# Patient Record
Sex: Female | Born: 1985 | Race: White | Hispanic: Yes | Marital: Married | Smoking: Never smoker
Health system: Southern US, Community
[De-identification: ages and names within clinical notes are randomized; demographics above are authoritative.]

---

## 2017-07-13 ENCOUNTER — Ambulatory Visit (INDEPENDENT_AMBULATORY_CARE_PROVIDER_SITE_OTHER): Payer: 59

## 2017-07-13 ENCOUNTER — Ambulatory Visit: Payer: 59 | Admitting: Sports Medicine

## 2017-07-13 ENCOUNTER — Encounter: Payer: Self-pay | Admitting: Sports Medicine

## 2017-07-13 DIAGNOSIS — M5412 Radiculopathy, cervical region: Secondary | ICD-10-CM

## 2017-07-13 DIAGNOSIS — M5416 Radiculopathy, lumbar region: Secondary | ICD-10-CM

## 2017-07-13 MED ORDER — CYCLOBENZAPRINE HCL 10 MG PO TABS: ORAL_TABLET | ORAL | 0 refills | Status: DC

## 2017-07-13 NOTE — Assessment & Plan Note (Signed)
Left L4 radiculitis, has already had x-rays at an outside facility. Adding formal physical therapy, continue meloxicam, Flexeril low-dose at bedtime. Return in 6 weeks, MR for interventional planning if no better, we did discuss the natural history of degenerative disc disease.

## 2017-07-13 NOTE — Assessment & Plan Note (Signed)
Formal physical therapy, continue meloxicam, Flexeril at bedtime. X-rays. Return to see me in 6 weeks for this as well.

## 2017-07-13 NOTE — Progress Notes (Signed)
   Subjective:    I'm seeing this patient as a consultation for:  Dr. Darlis LoanFaiza Rais-Reynolds  CC: Back and leg pain  HPI: For the past several years this pleasant 31 year old female has had pain in her back with radiation down the left leg, to the great toe, worse with sitting, flexion, Valsalva, no bowel or bladder dysfunction, saddle numbness, no constitutional symptoms.  She has been to a chiropractor several times with meager benefits, has never done formal physical therapy, is using meloxicam with some efficacy.  In addition she has pain in her neck with radiation down the right arm to the hand with some weakness.  This is fairly stable, present for years as well.  Moderate, persistent without radiation.  Past medical history, Surgical history, Family history not pertinant except as noted below, Social history, Allergies, and medications have been entered into the medical record, reviewed, and no changes needed.   Review of Systems: No headache, visual changes, nausea, vomiting, diarrhea, constipation, dizziness, abdominal pain, skin rash, fevers, chills, night sweats, weight loss, swollen lymph nodes, body aches, joint swelling, muscle aches, chest pain, shortness of breath, mood changes, visual or auditory hallucinations.   Objective:   General: Well Developed, well nourished, and in no acute distress.  Neuro:  Extra-ocular muscles intact, able to move all 4 extremities, sensation grossly intact.  Deep tendon reflexes tested were normal. Psych: Alert and oriented, mood congruent with affect. ENT:  Ears and nose appear unremarkable.  Hearing grossly normal. Neck: Unremarkable overall appearance, trachea midline.  No visible thyroid enlargement. Eyes: Conjunctivae and lids appear unremarkable.  Pupils equal and round. Skin: Warm and dry, no rashes noted.  Cardiovascular: Pulses palpable, no extremity edema. Neck: Negative spurling's Full neck range of motion Grip strength and sensation  normal in bilateral hands Strength good C4 to T1 distribution No sensory change to C4 to T1 Reflexes normal Back Exam:  Inspection: Unremarkable  Motion: Flexion 45 deg, Extension 45 deg, Side Bending to 45 deg bilaterally,  Rotation to 45 deg bilaterally  SLR laying: Negative  XSLR laying: Negative  Palpable tenderness: None. FABER: negative. Sensory change: Gross sensation intact to all lumbar and sacral dermatomes.  Reflexes: 2+ at both patellar tendons, 2+ at achilles tendons, Babinski's downgoing.  Strength at foot  Plantar-flexion: 5/5 Dorsi-flexion: 5/5 Eversion: 5/5 Inversion: 5/5  Leg strength  Quad: 5/5 Hamstring: 5/5 Hip flexor: 5/5 Hip abductors: 5/5  Gait unremarkable.  Impression and Recommendations:   This case required medical decision making of moderate complexity.  Left lumbar radiculitis Left L4 radiculitis, has already had x-rays at an outside facility. Adding formal physical therapy, continue meloxicam, Flexeril low-dose at bedtime. Return in 6 weeks, MR for interventional planning if no better, we did discuss the natural history of degenerative disc disease.  Radiculitis of right cervical region Formal physical therapy, continue meloxicam, Flexeril at bedtime. X-rays. Return to see me in 6 weeks for this as well.  ___________________________________________ Ihor Austinhomas J. Benjamin Stainhekkekandam, M.D., ABFM., CAQSM. Primary Care and Sports Medicine [facility]  Adjunct Instructor of Family Medicine  [employer]

## 2017-08-02 ENCOUNTER — Ambulatory Visit: Payer: 59 | Admitting: Rehabilitative and Restorative Service Providers"

## 2017-08-26 ENCOUNTER — Ambulatory Visit (INDEPENDENT_AMBULATORY_CARE_PROVIDER_SITE_OTHER): Payer: 59 | Admitting: Rehabilitative and Restorative Service Providers"

## 2017-08-26 ENCOUNTER — Encounter (INDEPENDENT_AMBULATORY_CARE_PROVIDER_SITE_OTHER): Payer: Self-pay

## 2017-08-26 ENCOUNTER — Encounter: Payer: Self-pay | Admitting: Rehabilitative and Restorative Service Providers"

## 2017-08-26 ENCOUNTER — Other Ambulatory Visit: Payer: Self-pay | Admitting: Sports Medicine

## 2017-08-26 DIAGNOSIS — M545 Low back pain: Secondary | ICD-10-CM | POA: Diagnosis not present

## 2017-08-26 DIAGNOSIS — M542 Cervicalgia: Secondary | ICD-10-CM

## 2017-08-26 DIAGNOSIS — R293 Abnormal posture: Secondary | ICD-10-CM

## 2017-08-26 DIAGNOSIS — M5416 Radiculopathy, lumbar region: Secondary | ICD-10-CM

## 2017-08-26 DIAGNOSIS — R29898 Other symptoms and signs involving the musculoskeletal system: Secondary | ICD-10-CM

## 2017-08-26 NOTE — Patient Instructions (Addendum)
Trunk: Prone Extension (Press-Ups)    Lie on stomach on firm, flat surface. Relax bottom and legs. Raise chest in air with elbows straight. Keep hips flat on surface, sag stomach. Hold __2-3 __ seconds. Repeat _10___ times. Do __1__ sessions per day. CAUTION: Movement should be gentle and slow.    Pelvic Press    Place hands under belly between navel and pubic bone, palms up. Feel pressure on hands. Increase pressure on hands by pressing pelvis down. This is NOT a pelvic tilt. Hold _5__ seconds. Relax. Repeat _10__ times.  HIP: Hamstrings - Supine  Place strap around foot. Raise leg up, keeping knee straight.  Bend opposite knee to protect back if indicated. Hold 30 seconds. 3 reps per set, 2-3 sets per day  Outer Hip Stretch: Reclined IT Band Stretch (Strap)   Strap around one foot, pull leg out away from body until you feel a pull or stretch in the inside of your hip, with shoulders on mat. Hold for 30 seconds. Repeat 3 times each leg. 2-3 times/day.    TENS UNIT: This is helpful for muscle pain and spasm.   Search and Purchase a TENS 7000 2nd edition at www.tenspros.com. It should be less than $30.     TENS unit instructions: Do not shower or bathe with the unit on Turn the unit off before removing electrodes or batteries If the electrodes lose stickiness add a drop of water to the electrodes after they are disconnected from the unit and place on plastic sheet. If you continued to have difficulty, call the TENS unit company to purchase more electrodes. Do not apply lotion on the skin area prior to use. Make sure the skin is clean and dry as this will help prolong the life of the electrodes. After use, always check skin for unusual red areas, rash or other skin difficulties. If there are any skin problems, does not apply electrodes to the same area. Never remove the electrodes from the unit by pulling the wires. Do not use the TENS unit or electrodes other than as  directed. Do not change electrode placement without consultating your therapist or physician. Keep 2 fingers with between each electrode.

## 2017-08-26 NOTE — Therapy (Signed)
[facility] [address] [place], [place], [address] Phone: [phone]   Fax:  [phone]  Physical Therapy Evaluation  Patient Details  Name: Jacqueline StagerMelissa Rocha MRN: [id number] Date of Birth: 01-14-1986 Referring Provider: Dr Benjamin Stainhekkekandam   Encounter Date: 08/26/2017  PT End of Session - 08/26/17 0852    Visit Number  1    Number of Visits  12    Date for PT Re-Evaluation  10/07/17    PT Start Time  0850    PT Stop Time  0948    PT Time Calculation (min)  58 min    Activity Tolerance  Patient tolerated treatment well       History reviewed. No pertinent past medical history.  History reviewed. No pertinent surgical history.  There were no vitals filed for this visit.   Subjective Assessment - 08/26/17 0900    Subjective  Patient reports history of LBP for 11 years. She has stiffness and paiin in the LB radiating into the Lt LE posterior thigh into lateral calf. She has had flare up of pain over the past 4 months. Patient is not aware of any injury. She has had pain since she gave birth to her daughter 11 years ago.     Pertinent History  Rt cervical radiculopathy with Rt UE weakness and sensory changes - partially resolved with therapy Feb-Mar 2018    How long can you sit comfortably?  15 min     How long can you stand comfortably?  5 min     How long can you walk comfortably?  30 min     Diagnostic tests  xrays     Patient Stated Goals  learn how to manage LBP without depend on medication     Currently in Pain?  No/denies    Pain Location  Back    Pain Orientation  Left;Lower;Mid    Pain Descriptors / Indicators  Sore;Tightness    Pain Type  Chronic pain    Pain Radiating Towards  Lt posterior thigh to knee to lateral calf     Pain Onset  More than a month ago    Pain Frequency  Intermittent    Aggravating Factors   bending forward; prolonged standing; prolonged sitting; footwear - worse with older shoes;  carrying; lifting    Pain Relieving Factors  meds; squatting against wall; twisting; chiropractic care          [facility] Assessment - 08/26/17 0001      Assessment   Medical Diagnosis  Lt lumbar radiculitis; Rt cervical radiculopathy     Referring Provider  Dr Benjamin Stainhekkekandam    Onset Date/Surgical Date  03/23/17 LBP pain intermittently for 11 years     Hand Dominance  Right    Next MD Visit  PRN     Prior Therapy  yes for cervical/shoulder problems 2018; chiropractic care for LB for ~ 2 years       Precautions   Precautions  None      Balance Screen   Has the patient fallen in the past 6 months  Yes    How many times?  2    Has the patient had a decrease in activity level because of a fear of falling?   No    Is the patient reluctant to leave their home because of a fear of falling?   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     Leisure  household chores; child care; church      Observation/Other Assessments   Focus on Therapeutic Outcomes (FOTO)   54% limitation       Sensation   Additional Comments  no numbness per pt report       Posture/Postural Control   Posture Comments  head forward; shoulders rounded and elevatred; Lt PSIS higher than Rt;       AROM   Lumbar Flexion  100%    Lumbar Extension  70%    Lumbar - Right Side Bend  80%    Lumbar - Left Side Bend  75% pain Rt LB     Lumbar - Right Rotation  65%    Lumbar - Left Rotation  60%      Strength   Overall Strength Comments  5/5 bilat LE's       Flexibility   Hamstrings  tight Rt > Lt     Quadriceps  WFL's     ITB  tight Rt    Piriformis  tight Lt with pain in Rt LB       Palpation   Spinal mobility   hypomobile lumbar spine; tender to palpation coccyx with pain referred to posterior Lt thigh     SI assessment   elevated Lt PSIS; illium    Palpation comment  tight Rt psoas; Lt QL; coccyx       Special Tests   Other special tests  Figure 4 - slight pain Rt testing - pain in the Rt LB area              Objective measurements completed on examination: See above findings.      [facility] Adult PT Treatment/Exercise - 08/26/17 0001      Lumbar Exercises: Stretches   Passive Hamstring Stretch  2 reps;30 seconds supine with strap     Hip Flexor Stretch  2 reps;30 seconds seated - felt stretch in quads Rt > Lt     Press Ups  -- 2-3 sec hold x 10     ITB Stretch Limitations  hip adductor stretch 30 sec x 2 Rt       Lumbar Exercises: Prone   Other Prone Lumbar Exercises  pelvic press 5 sec x 10       Moist Heat Therapy   Number Minutes Moist Heat  20 Minutes    Moist Heat Location  Lumbar Spine      Electrical Stimulation   Electrical Stimulation Location  bilat lumbar     Electrical Stimulation Action  IFC    Electrical Stimulation Parameters  to tolerance    Electrical Stimulation Goals  Pain;Tone             PT Education - 08/26/17 0932    Education provided  Yes    Education Details  HEP     Person(s) Educated  Patient    Methods  Explanation;Demonstration;Tactile cues;Verbal cues    Comprehension  Verbalized understanding;Returned demonstration;Verbal cues required;Tactile cues required          PT Long Term Goals - 08/26/17 1148      PT LONG TERM  GOAL #1   Title  Improve posture and alignment without hyperextension of knees and improved alignment through hips and pelvis 10/07/17    Time  6    Period  Weeks    Status  New      PT LONG TERM GOAL #2   Title  increase core strength and stability with patient tolerating increased sitting; standing; walking by 50% with decreased pain 10/07/17    Time  6    Period  Weeks    Status  New      PT LONG TERM GOAL  #3   Title  Patient reports understanding of back care; ergonomics; appropriate exercise program 10/07/17    Time  6    Period  Weeks    Status  New      PT LONG TERM GOAL #4   Title  Independent in HEP 10/07/17    Time  6    Period  Weeks    Status  New      PT LONG TERM GOAL #5   Title  Improve FOTO to </= 42% limitation 10/07/17    Time  6    Period  Weeks    Status  New             Plan - 08/26/17 1143    Clinical Impression Statement  Murrel presents with flare up of LBP and Lt LE radicular pain over the past 4 months. She has had LBP for ~11 years which started following the birth of her daughter. Valicia has poor posture and alignment; limited trunk and LE mobility/ROM; poor core strength and stability; muscular tightness to palpation - Rt psoas/Rt piriformis; tenderness and pain with palpation at coccyx; decreased functional activities. Patient wil benefit form PT to address problems identified.     History and Personal Factors relevant to plan of care:  cervical dysfunction; Rt cervical radiculopathy; Rt shoulder pathology    Clinical Presentation  Stable    Clinical Decision Making  Low    Rehab Potential  Good    PT Frequency  2x / week    PT Duration  6 weeks    PT Treatment/Interventions  Patient/family education;ADLs/Self Care Home Management;Cryotherapy;Electrical Stimulation;Iontophoresis 4mg /ml Dexamethasone;Moist Heat;Ultrasound;Dry needling;Manual techniques;Neuromuscular re-education;Therapeutic activities;Therapeutic exercise    PT Next Visit Plan  review exercise; further assessment of SI/pelvic dysfunction; manual therapy; ther ex; modalities as indicated     Consulted and Agree with Plan of Care  Patient       Patient will benefit from skilled therapeutic intervention in order to improve the following deficits and impairments:  Postural dysfunction, Improper body mechanics, Pain, Increased fascial restricitons, Increased muscle spasms, Decreased mobility,  Decreased range of motion, Decreased strength, Decreased activity tolerance  Visit Diagnosis: Acute left-sided low back pain, with sciatica presence unspecified - Plan: PT plan of care cert/re-cert  Other symptoms and signs involving the musculoskeletal system - Plan: PT plan of care cert/re-cert  Cervicalgia - Plan: PT plan of care cert/re-cert  Abnormal posture - Plan: PT plan of care cert/re-cert     Problem List Patient Active Problem List   Diagnosis Date Noted  . Left lumbar radiculitis 07/13/2017  . Radiculitis of right cervical region 07/13/2017    Greogry Goodwyn Rober Minion PT, MPH  08/26/2017, 12:55 PM  [facility] [address] [place], [place], [address] Phone: [phone]   Fax:  [phone]  Name: Genavieve Mangiapane MRN: [id number] Date of Birth: [birth year]

## 2017-08-29 ENCOUNTER — Ambulatory Visit: Payer: 59 | Admitting: Physical Therapy

## 2017-08-29 ENCOUNTER — Encounter: Payer: Self-pay | Admitting: Physical Therapy

## 2017-08-29 DIAGNOSIS — R293 Abnormal posture: Secondary | ICD-10-CM | POA: Diagnosis not present

## 2017-08-29 DIAGNOSIS — R29898 Other symptoms and signs involving the musculoskeletal system: Secondary | ICD-10-CM | POA: Diagnosis not present

## 2017-08-29 DIAGNOSIS — M545 Low back pain: Secondary | ICD-10-CM

## 2017-08-29 DIAGNOSIS — M542 Cervicalgia: Secondary | ICD-10-CM

## 2017-08-29 NOTE — Patient Instructions (Addendum)
WALKING  Walking is a great form of exercise to increase your strength, endurance and overall fitness.  A walking program can help you start slowly and gradually build endurance as you go.  Everyone's ability is different, so each person's starting point will be different.  You do not have to follow them exactly.  The are just samples. You should simply find out what's right for you and stick to that program.   In the beginning, you'll start off walking 2-3 times a day for short distances.  As you get stronger, you'll be walking further at just 1-2 times per day.  A. You Can Walk For A Certain Length Of Time Each Day    Walk 5 minutes 3 times per day.  Increase 2 minutes every 2 days (3 times per day).  Work up to 25-30 minutes (1-2 times per day).   Example:   Day 1-2 5 minutes 3 times per day   Day 7-8 12 minutes 2-3 times per day   Day 13-14 25 minutes 1-2 times per day  B. You Can Walk For a Certain Distance Each Day     Distance can be substituted for time.    Example:   3 trips to mailbox (at road)   3 trips to corner of block   3 trips around the block  C. Go to local high school and use the track.    [facility] [address] [place], [place] [address]  [phone] (office) [phone] (fax)

## 2017-08-29 NOTE — Therapy (Signed)
[place], [place], [address] Phone: [phone]   Fax:  [phone]  Physical Therapy Treatment  Patient Details  Name: Jacqueline Rocha MRN: [id number] Date of Birth: [birth year] Referring Provider: Dr. Dianah Field   Encounter Date: 08/29/2017  PT End of Session - 08/29/17 0725    Visit Number  2    Number of Visits  12    Date for PT Re-Evaluation  10/07/17    PT Start Time  0724    PT Stop Time  0807    PT Time Calculation (min)  43 min    Activity Tolerance  Patient tolerated treatment well;No increased pain    Behavior During Therapy  [facility] for tasks assessed/performed       History reviewed. No pertinent past medical history.  History reviewed. No pertinent surgical history.  There were no vitals filed for this visit.  Subjective Assessment - 08/29/17 0726    Subjective  Jacqueline Rocha reports she was sore, "in a good way".  She notices if she stands in one position longer than 1/2 hr, it hurts when she moves. She believes her back pain began when she started current job 3 yrs ago, not walking as much.     Pertinent History  Rt cervical radiculopathy with Rt UE weakness and sensory changes - partially resolved with therapy Feb-Mar 2018    Patient Stated Goals  learn how to manage LBP without depend on medication     Currently in Pain?  No/denies         [facility] PT Assessment - 08/29/17 0001      Assessment   Medical Diagnosis  Lt lumbar radiculitis; Rt cervical radiculopathy     Referring Provider  Dr. Dianah Field    Onset Date/Surgical Date  03/23/17 LBP pain intermittently for 11 years     Hand Dominance  Right    Next MD Visit  PRN      Palpation   SI assessment   In standing: Rt elevated ilium; Lt ASIS higer than Rt. Rt sacral torsion.           [facility] Adult PT Treatment/Exercise - 08/29/17 0001      Self-Care   Self-Care  Other Self-Care Comments    Other Self-Care Comments    educated pt on freq position changes at work, and on starting walking program.       Lumbar Exercises: Stretches   Passive Hamstring Stretch  2 reps;60 seconds seated and supine    Hip Flexor Stretch  2 reps;30 seconds seated - felt stretch in quads Rt > Lt     Standing Extension  3 reps 2-3 sec pause    Piriformis Stretch  2 reps;30 seconds      Lumbar Exercises: Aerobic   Stationary Bike  NuStep L5: 5.5 min       Lumbar Exercises: Seated   Other Seated Lumbar Exercises  modified pigeon pose x 2 reps each leg with yoga block assist       Modalities   Modalities  -- pt declined      Manual Therapy   Manual Therapy  Muscle Energy Technique    Muscle Energy Technique  MET to correct Rt sacral torsion with contract relax of hip rotators and stretching into IR.              PT Education - 08/29/17 1405    Education provided  Yes    Education Details  Walking program  guidelines.     Person(s) Educated  Patient    Methods  Explanation;Demonstration;Handout    Comprehension  Verbalized understanding;Returned demonstration          PT Long Term Goals - 08/29/17 0743      PT LONG TERM GOAL #1   Title  Improve posture and alignment without hyperextension of knees and improved alignment through hips and pelvis 10/07/17    Time  6    Period  Weeks    Status  On-going      PT LONG TERM GOAL #2   Title  increase core strength and stability with patient tolerating increased sitting; standing; walking by 50% with decreased pain 10/07/17    Time  6    Period  Weeks    Status  On-going      PT LONG TERM GOAL #3   Title  Patient reports understanding of back care; ergonomics; appropriate exercise program 10/07/17    Time  6    Period  Weeks    Status  On-going      PT LONG TERM GOAL #4   Title  Independent in HEP 10/07/17    Time  6    Period  Weeks    Status  On-going      PT LONG TERM GOAL #5   Title  Improve FOTO to </= 42% limitation 10/07/17    Time  6    Period   Weeks    Status  On-going            Plan - 08/29/17 1406    Clinical Impression Statement  Pt reporting less pain since last visit. Pt shown alternative versions to allow for improved compliance of HEP.  Pt had slight asymmetry in pelvis; will further assess next visit.  Progressing towards goals.     Rehab Potential  Good    PT Frequency  2x / week    PT Duration  6 weeks    PT Treatment/Interventions  Patient/family education;ADLs/Self Care Home Management;Cryotherapy;Electrical Stimulation;Iontophoresis 32m/ml Dexamethasone;Moist Heat;Ultrasound;Dry needling;Manual techniques;Neuromuscular re-education;Therapeutic activities;Therapeutic exercise    PT Next Visit Plan  core/spinal stabilization; assess pelvis alignment.     Consulted and Agree with Plan of Care  Patient       Patient will benefit from skilled therapeutic intervention in order to improve the following deficits and impairments:  Postural dysfunction, Improper body mechanics, Pain, Increased fascial restricitons, Increased muscle spasms, Decreased mobility, Decreased range of motion, Decreased strength, Decreased activity tolerance  Visit Diagnosis: Acute left-sided low back pain, with sciatica presence unspecified  Other symptoms and signs involving the musculoskeletal system  Cervicalgia  Abnormal posture     Problem List Patient Active Problem List   Diagnosis Date Noted  . Left lumbar radiculitis 07/13/2017  . Radiculitis of right cervical region 07/13/2017   JKerin Perna PTA 08/29/17 2:14 PM  [address] [address]: [phone]  Fax:  [phone] Name: MCortny BambachMRN: [id number] of Birth: [birth year]

## 2017-08-30 MED ORDER — CYCLOBENZAPRINE HCL 10 MG PO TABS: ORAL_TABLET | ORAL | 0 refills | Status: AC

## 2017-09-01 ENCOUNTER — Ambulatory Visit (INDEPENDENT_AMBULATORY_CARE_PROVIDER_SITE_OTHER): Payer: 59 | Admitting: Physical Therapy

## 2017-09-01 ENCOUNTER — Encounter: Payer: Self-pay | Admitting: Physical Therapy

## 2017-09-01 DIAGNOSIS — R29898 Other symptoms and signs involving the musculoskeletal system: Secondary | ICD-10-CM

## 2017-09-01 DIAGNOSIS — R293 Abnormal posture: Secondary | ICD-10-CM | POA: Diagnosis not present

## 2017-09-01 DIAGNOSIS — M542 Cervicalgia: Secondary | ICD-10-CM

## 2017-09-01 DIAGNOSIS — M545 Low back pain: Secondary | ICD-10-CM | POA: Diagnosis not present

## 2017-09-01 NOTE — Patient Instructions (Signed)
Pelvic Press     Place hands under belly between navel and pubic bone, palms up. Feel pressure on hands. Increase pressure on hands by pressing pelvis down. This is NOT a pelvic tilt. Hold __5_ seconds. Relax. Repeat _10__ times. Once a day.  KNEE: Flexion - Prone   Hold pelvic press. Bend knee, then return the foot down. Repeat on opposite leg. Do not raise hips. _10__ reps per set. When this is mastered, pull both heels up at same time, x 10 reps.  Once a day   Leg Lift: One-Leg   Press pelvis down. Keep knee straight; lengthen and lift one leg (from waist). Do not twist body. Keep other leg down. Hold _1__ seconds. Relax. Repeat 10 time. Repeat with other leg.  HIP: Extension / KNEE: Flexion - Prone    Hold pelvic press. Bend knee, squeeze glutes. Raise leg up  10___ reps per set, _1__ sets per day, _1__ time a day.   Axial Extension- Upper body sequence * always start with pelvic press    Lie on stomach with forehead resting on floor and arms at sides. Tuck chin in and raise head from floor without bending it up or down. Repeat ___10_ times per set. Do __1__ sets per session. Do _1___ sessions per day.  Scapular Retraction: Abduction (Prone)    Lie with upper arms straight out from sides, elbows bent to 90. Pinch shoulder blades together and raise arms a few inches from floor. Repeat ____ times per set. Do ____ sets per session. Do ____ sessions per day.  http://orth.exer.us/957   Copyright  VHI. All rights reserved.   [facility] [address] [place], [place] [address]  [phone] (office) [phone] (fax)

## 2017-09-01 NOTE — Therapy (Signed)
[facility] [address] [place], [place], [address] Phone: [phone]   Fax:  [phone]  Physical Therapy Treatment  Patient Details  Name: Jacqueline Rocha MRN: [id number] Date of Birth: 1986/07/10 Referring Provider: Dr. Benjamin Stain   Encounter Date: 09/01/2017  PT End of Session - 09/01/17 0817    Visit Number  3    Number of Visits  12    Date for PT Re-Evaluation  10/07/17    PT Start Time  0810 pt arrived late    PT Stop Time  0901    PT Time Calculation (min)  51 min    Activity Tolerance  Patient tolerated treatment well;No increased pain    Behavior During Therapy  [facility] for tasks assessed/performed       History reviewed. No pertinent past medical history.  History reviewed. No pertinent surgical history.  There were no vitals filed for this visit.  Subjective Assessment - 09/01/17 0818    Subjective  "I've been able to do something I haven't been able to do in a long time.  I bathed the dog."  She also states she didn't take any of her medicine yesterday and she is surprised, she still feels good.  She is trying to increase her steps daily.     Patient Stated Goals  learn how to manage LBP without depend on medication     Currently in Pain?  No/denies         [facility] PT Assessment - 09/01/17 0001      Assessment   Medical Diagnosis  Lt lumbar radiculitis; Rt cervical radiculopathy     Referring Provider  Dr. Benjamin Stain    Onset Date/Surgical Date  03/23/17 LBP pain intermittently for 11 years     Hand Dominance  Right    Next MD Visit  PRN        [facility] Adult PT Treatment/Exercise - 09/01/17 0001      Lumbar Exercises: Stretches   Passive Hamstring Stretch  Right;Left;2 reps;30 seconds    Press Ups  -- 2-3 sec hold x 10       Lumbar Exercises: Aerobic   Stationary Bike  NuStep L5: 5.5 min  (arms/ legs)      Lumbar Exercises: Seated   Other Seated Lumbar Exercises  modified childs pose  with lateral trunk flexion, with arms outstretched on table, 2 reps each direction      Lumbar Exercises: Prone   Other Prone Lumbar Exercises  pelvic press x 5 sec x 5 reps; pelvic press with unilateral knee bend x 5 reps each leg, with hip ext knee straight x 5 reps each, with hip ext knee bent x 5 each leg.   goal post arms with axial ext x 10 reps    Other Prone Lumbar Exercises  pigeon pose x 30 sec x 2 reps each side.       Lumbar Exercises: Quadruped   Other Quadruped Lumbar Exercises  childs pose x 3 reps, then 1 rep with lateral trunkl flexion      Moist Heat Therapy   Number Minutes Moist Heat  15 Minutes    Moist Heat Location  Lumbar Spine      Electrical Stimulation   Electrical Stimulation Location  bilat upper trap/ bilat lumbar paraspinals    Electrical Stimulation Action  premod to each area    Electrical Stimulation Parameters  to tolerance    Electrical Stimulation Goals  Pain;Tone  PT Education - 09/01/17 [phone]    Education provided  Yes    Education Details  HEP    Person(s) Educated  Patient    Methods  Explanation;Handout;Demonstration;Tactile cues;Verbal cues    Comprehension  Verbalized understanding;Returned demonstration          PT Long Term Goals - 08/29/17 0743      PT LONG TERM GOAL #1   Title  Improve posture and alignment without hyperextension of knees and improved alignment through hips and pelvis 10/07/17    Time  6    Period  Weeks    Status  On-going      PT LONG TERM GOAL #2   Title  increase core strength and stability with patient tolerating increased sitting; standing; walking by 50% with decreased pain 10/07/17    Time  6    Period  Weeks    Status  On-going      PT LONG TERM GOAL #3   Title  Patient reports understanding of back care; ergonomics; appropriate exercise program 10/07/17    Time  6    Period  Weeks    Status  On-going      PT LONG TERM GOAL #4   Title  Independent in HEP 10/07/17    Time  6     Period  Weeks    Status  On-going      PT LONG TERM GOAL #5   Title  Improve FOTO to </= 42% limitation 10/07/17    Time  6    Period  Weeks    Status  On-going            Plan - 09/01/17 0851    Clinical Impression Statement  Pt had positive response to last treatment; reporting less pain and has been able to increase her steps/day.  She tolerated all exercises well without difficulty, just slight soreness.  Pt making great gains towards therapy goals.     Rehab Potential  Good    PT Frequency  2x / week    PT Duration  6 weeks    PT Treatment/Interventions  Patient/family education;ADLs/Self Care Home Management;Cryotherapy;Electrical Stimulation;Iontophoresis 4mg /ml Dexamethasone;Moist Heat;Ultrasound;Dry needling;Manual techniques;Neuromuscular re-education;Therapeutic activities;Therapeutic exercise    PT Next Visit Plan  core/spinal stabilization; assess pelvis alignment.     Consulted and Agree with Plan of Care  Patient       Patient will benefit from skilled therapeutic intervention in order to improve the following deficits and impairments:  Postural dysfunction, Improper body mechanics, Pain, Increased fascial restricitons, Increased muscle spasms, Decreased mobility, Decreased range of motion, Decreased strength, Decreased activity tolerance  Visit Diagnosis: Acute left-sided low back pain, with sciatica presence unspecified  Other symptoms and signs involving the musculoskeletal system  Cervicalgia  Abnormal posture     Problem List Patient Active Problem List   Diagnosis Date Noted  . Left lumbar radiculitis 07/13/2017  . Radiculitis of right cervical region 07/13/2017   Mayer CamelJennifer Carlson-Long, PTA 09/01/17 9:00 AM  [facility] [address] [place], [place], [address] Phone: [phone]   Fax:  [phone]  Name: Jacqueline Rocha MRN: [id number] Date of Birth: [birth year]

## 2017-09-05 ENCOUNTER — Ambulatory Visit (INDEPENDENT_AMBULATORY_CARE_PROVIDER_SITE_OTHER): Payer: 59 | Admitting: Rehabilitative and Restorative Service Providers"

## 2017-09-05 ENCOUNTER — Encounter: Payer: Self-pay | Admitting: Rehabilitative and Restorative Service Providers"

## 2017-09-05 DIAGNOSIS — R29898 Other symptoms and signs involving the musculoskeletal system: Secondary | ICD-10-CM

## 2017-09-05 DIAGNOSIS — M542 Cervicalgia: Secondary | ICD-10-CM

## 2017-09-05 DIAGNOSIS — M545 Low back pain: Secondary | ICD-10-CM | POA: Diagnosis not present

## 2017-09-05 DIAGNOSIS — R293 Abnormal posture: Secondary | ICD-10-CM

## 2017-09-05 NOTE — Therapy (Signed)
[facility] [address] [place], [place], [address] Phone: [phone]   Fax:  [phone]  Physical Therapy Treatment  Patient Details  Name: Jacqueline Rocha MRN: [id number] Date of Birth: 04/27/1986 Referring Provider: Dr. Thekkekandam   Encounter Date: 09/05/2017  PT End of Session - 09/05/17 1612    Visit Number  4    Number of Visits  12    Date for PT Re-Evaluation  10/07/17    PT Start Time  1610    PT Stop Time  1655    PT Time Calculation (min)  45 min    Activity Tolerance  Patient tolerated treatment well       History reviewed. No pertinent past medical history.  History reviewed. No pertinent surgical history.  There were no vitals filed for this visit.  Subjective Assessment - 09/05/17 1614    Subjective  Feeling some better. Adjustments Jenn made at last visit seemed to help. Can now feel muscules working that are suppose to be working.     Currently in Pain?  No/denies                      [facility] Adult PT Treatment/Exercise - 09/05/17 0001      Lumbar Exercises: Stretches   Passive Hamstring Stretch  Right;Left;2 reps;30 seconds      Lumbar Exercises: Aerobic   Stationary Bike  NuStep L6: 5 min  (arms/ legs)      Lumbar Exercises: Standing   Row  Strengthening;Both;Theraband;10 reps    Shoulder Extension  Strengthening;Both;10 reps;Theraband      Lumbar Exercises: Seated   Other Seated Lumbar Exercises  modified childs pose with lateral trunk flexion, with arms outstretched on table, 2 reps each direction      Lumbar Exercises: Prone   Other Prone Lumbar Exercises  pelvic press x 5 sec x 10      Lumbar Exercises: Quadruped   Other Quadruped Lumbar Exercises  child's pose x 3 20-30 sec hold       Moist Heat Therapy   Number Minutes Moist Heat  15 Minutes    Moist Heat Location  Lumbar Spine      Electrical Stimulation   Electrical Stimulation Location  bilat upper trap/  bilat lumbar paraspinals    Electrical Stimulation Action  HVGS    Electrical Stimulation Parameters  to tolerance    Electrical Stimulation Goals  Pain;Tone      Manual Therapy   Manual therapy comments  Rt sacral rotation     Muscle Energy Technique  MET to correct Rt sacral torsion with contract relax of hip rotators and stretching into IR manual work by Jenn Carlson Long, PTA              PT Education - 09/05/17 1637    Education provided  Yes    Education Details  HEP     Person(s) Educated  Patient    Methods  Explanation;Demonstration;Tactile cues;Verbal cues;Handout    Comprehension  Verbalized understanding;Returned demonstration;Verbal cues required;Tactile cues required          PT Long Term Goals - 09/05/17 1615      PT LONG TERM GOAL #1   Title  Improve posture and alignment without hyperextension of knees and improved alignment through hips and pelvis 10/07/17    Time  6    Period  Weeks    Status  On-going      PT LONG TERM   GOAL #2   Title  increase core strength and stability with patient tolerating increased sitting; standing; walking by 50% with decreased pain 10/07/17    Time  6    Period  Weeks    Status  On-going      PT LONG TERM GOAL #3   Title  Patient reports understanding of back care; ergonomics; appropriate exercise program 10/07/17    Time  6    Period  Weeks    Status  On-going      PT LONG TERM GOAL #4   Title  Independent in HEP 10/07/17    Time  6    Period  Weeks    Status  On-going      PT LONG TERM GOAL #5   Title  Improve FOTO to </= 42% limitation 10/07/17    Time  6    Period  Weeks    Status  On-going              Patient will benefit from skilled therapeutic intervention in order to improve the following deficits and impairments:     Visit Diagnosis: Acute left-sided low back pain, with sciatica presence unspecified  Other symptoms and signs involving the musculoskeletal system  Cervicalgia  Abnormal  posture     Problem List Patient Active Problem List   Diagnosis Date Noted  . Left lumbar radiculitis 07/13/2017  . Radiculitis of right cervical region 07/13/2017    Celyn P Holt PT, MPH  09/05/2017, 4:42 PM  [facility] [address] [place], [place], [address] Phone: [phone]   Fax:  [phone]  Name: Jacqueline Rocha MRN: [id number] Date of Birth: 09/30/1985   

## 2017-09-05 NOTE — Patient Instructions (Signed)
   Low Row: Standing   Face anchor, feet shoulder width apart. Palms up, pull arms back, squeezing shoulder blades together. Repeat 10__ times per set. Do 2-3__ sets per session. Do  1-2 sessions per day. Anchor Height: Waist    Strengthening: Resisted Extension   Hold tubing in right hand, arm forward. Pull arm back, elbow straight. Repeat _10___ times per set. Do 2-3____ sets per session. Do 1-2 sessions per day.

## 2017-09-07 ENCOUNTER — Ambulatory Visit: Payer: 59 | Admitting: Physical Therapy

## 2017-09-07 DIAGNOSIS — R293 Abnormal posture: Secondary | ICD-10-CM

## 2017-09-07 DIAGNOSIS — M542 Cervicalgia: Secondary | ICD-10-CM

## 2017-09-07 DIAGNOSIS — R29898 Other symptoms and signs involving the musculoskeletal system: Secondary | ICD-10-CM

## 2017-09-07 DIAGNOSIS — M545 Low back pain: Secondary | ICD-10-CM

## 2017-09-07 NOTE — Therapy (Addendum)
[place], [place], [address] Phone: [phone]   Fax:  [phone]  Physical Therapy Treatment  Patient Details  Name: Jacqueline Rocha MRN: [id number] Date of Birth: [birth year] Referring Provider: Dr. Dianah Field   Encounter Date: 09/07/2017  PT End of Session - 09/07/17 1619    Visit Number  5    Number of Visits  12    Date for PT Re-Evaluation  10/07/17    PT Start Time  1608 pt arrived late    PT Stop Time  1651    PT Time Calculation (min)  43 min    Activity Tolerance  Patient tolerated treatment well    Behavior During Therapy  [facility] for tasks assessed/performed       No past medical history on file.  No past surgical history on file.  There were no vitals filed for this visit.  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 She "feels more energetic and able to do more now"; 30% improvement.  "I don't wake up stiff anymore".     Patient Stated Goals  learn how to manage LBP without depend on medication     Currently in Pain?  Yes    Pain Score  1     Pain Location  Back    Pain Orientation  Lower         [facility] PT Assessment - 09/07/17 0001      Assessment   Medical Diagnosis  Lt lumbar radiculitis; Rt cervical radiculopathy     Referring Provider  Dr. Dianah Field    Onset Date/Surgical Date  03/23/17    Hand Dominance  Right    Next MD Visit  PRN         [facility] Adult PT Treatment/Exercise - 09/07/17 0001      Lumbar Exercises: Stretches   Passive Hamstring Stretch  Right;Left;2 reps;60 seconds    Standing Extension  3 reps 2-3 sec pause      Lumbar Exercises: Aerobic   Stationary Bike  NuStep L5: 5 min  (arms/ legs)      Lumbar Exercises: Standing   Row  Strengthening;Both;Theraband;10 reps    Theraband Level (Row)  Level 2 (Red)    Shoulder  Extension  Strengthening;Both;10 reps;Theraband    Theraband Level (Shoulder Extension)  Level 2 (Red)    Other Standing Lumbar Exercises  wood chop with red band x 10 each side.       Lumbar Exercises: Seated   Other Seated Lumbar Exercises  core engagement with hand press down, up, and side/side into PTA hands       Lumbar Exercises: Prone   Opposite Arm/Leg Raise  Right arm/Left leg;Left arm/Right leg;5 reps;2 seconds    Other Prone Lumbar Exercises  pigeon pose x 30 sec x 2 reps each side.       Lumbar Exercises: Quadruped   [place]/Old Horse  10 reps    Opposite Arm/Leg Raise  Right arm/Left leg;Left arm/Right leg;5 reps;5 seconds    Other Quadruped Lumbar Exercises  childs pose       Modalities   Modalities  -- pt declined.              PT Education - 09/07/17 1626    Education provided  --    Education Details  --  PT Long Term Goals - 09/05/17 1615      PT LONG TERM GOAL #1   Title  Improve posture and alignment without hyperextension of knees and improved alignment through hips and pelvis 10/07/17    Time  6    Period  Weeks    Status  On-going      PT LONG TERM GOAL #2   Title  increase core strength and stability with patient tolerating increased sitting; standing; walking by 50% with decreased pain 10/07/17    Time  6    Period  Weeks    Status  On-going      PT LONG TERM GOAL #3   Title  Patient reports understanding of back care; ergonomics; appropriate exercise program 10/07/17    Time  6    Period  Weeks    Status  On-going      PT LONG TERM GOAL #4   Title  Independent in HEP 10/07/17    Time  6    Period  Weeks    Status  On-going      PT LONG TERM GOAL #5   Title  Improve FOTO to </= 42% limitation 10/07/17    Time  6    Period  Weeks    Status  On-going            Plan - 09/07/17 1647    Clinical Impression Statement  Pt tolerated treatment well, without any increase in pain.  She is reporting less pain with activity  during day.   Pt progressing towards goals.  Pt requests to hold therapy 2 wks while she works on [employer].     Rehab Potential  Good    PT Frequency  2x / week    PT Duration  6 weeks    PT Treatment/Interventions  Patient/family education;ADLs/Self Care Home Management;Cryotherapy;Electrical Stimulation;Iontophoresis 2m/ml Dexamethasone;Moist Heat;Ultrasound;Dry needling;Manual techniques;Neuromuscular re-education;Therapeutic activities;Therapeutic exercise    PT Next Visit Plan  spoke to supervising PT; will hold for 2 weeks (until 09/21/17) continue work on core/spinal stabilization; assess pelvis alignment upon her return.     Consulted and Agree with Plan of Care  Patient       Patient will benefit from skilled therapeutic intervention in order to improve the following deficits and impairments:  Postural dysfunction, Improper body mechanics, Pain, Increased fascial restricitons, Increased muscle spasms, Decreased mobility, Decreased range of motion, Decreased strength, Decreased activity tolerance  Visit Diagnosis: Acute left-sided low back pain, with sciatica presence unspecified  Other symptoms and signs involving the musculoskeletal system  Abnormal posture  Cervicalgia     Problem List Patient Active Problem List   Diagnosis Date Noted  . Left lumbar radiculitis 07/13/2017  . Radiculitis of right cervical region 07/13/2017   JKerin Perna PTA 09/07/17 5:05 PM  [place] [address]: [phone]  Fax:  [phone] Name: Jacqueline WinneMRN: [id number] of Birth: 2May 04, 1987 PHYSICAL THERAPY DISCHARGE SUMMARY  Visits from Start of Care: 5  Current functional level related to goals / functional outcomes: See last progress note for discharge status   Remaining deficits: Unknown    Education / Equipment: HEP  Plan: Patient agrees to discharge.  Patient  goals were met. Patient is being discharged due to being pleased with the current functional level.  ?????     Celyn P. HHelene KelpPT, MPH 10/05/17 2:47 PM

## 2017-09-12 ENCOUNTER — Encounter: Payer: Self-pay | Admitting: Physical Therapy

## 2017-09-14 ENCOUNTER — Encounter: Payer: Self-pay | Admitting: Physical Therapy

## 2019-05-31 ENCOUNTER — Other Ambulatory Visit: Payer: Self-pay

## 2019-05-31 DIAGNOSIS — Z20822 Contact with and (suspected) exposure to covid-19: Secondary | ICD-10-CM

## 2019-06-02 LAB — NOVEL CORONAVIRUS, NAA: SARS-CoV-2, NAA: NOT DETECTED

## 2019-08-08 IMAGING — DX DG CERVICAL SPINE COMPLETE 4+V
6 series · 6 of 6 positions shown · non-contrast
Comparison: None in PACs

CLINICAL DATA: Intermittent right-sided neck pain for the past year
recent change in the character of the pain. No known injury

EXAM:
CERVICAL SPINE - COMPLETE 4+ VIEW

[c-spine lat]
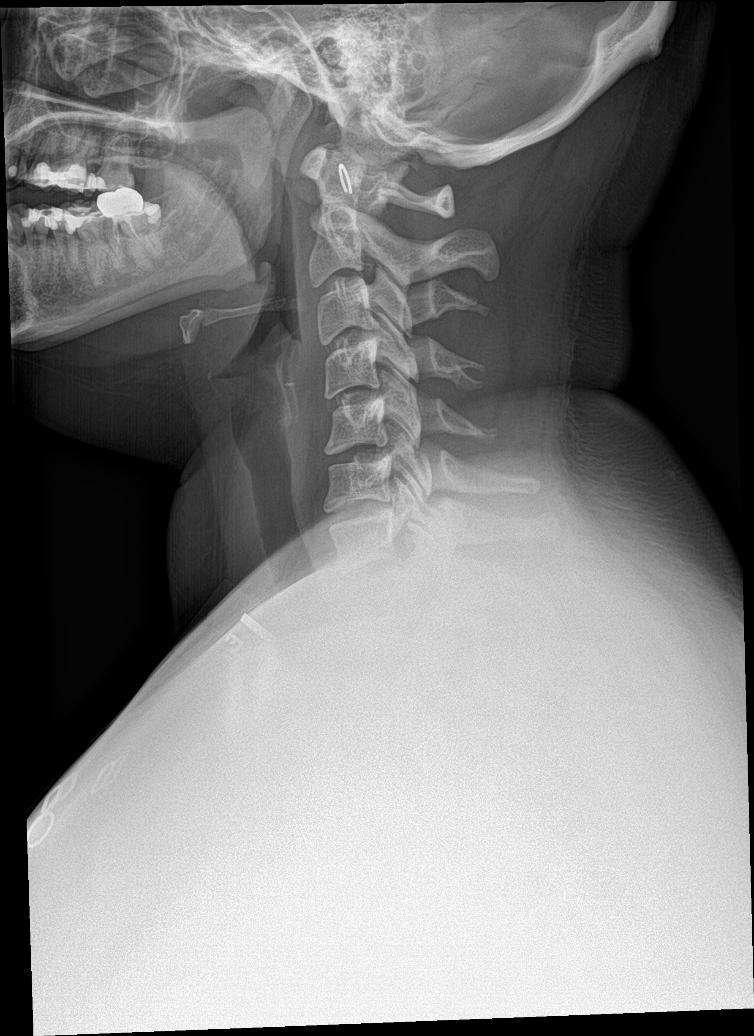

[c-spine obl (1 of 2)]
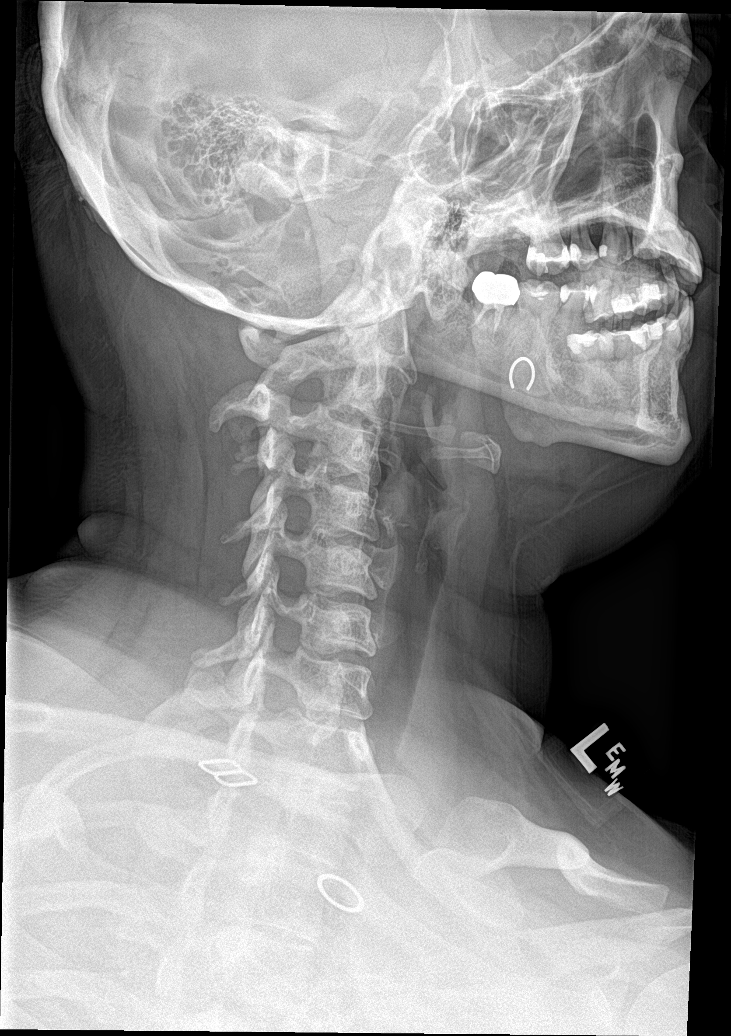

[c-spine obl (2 of 2)]
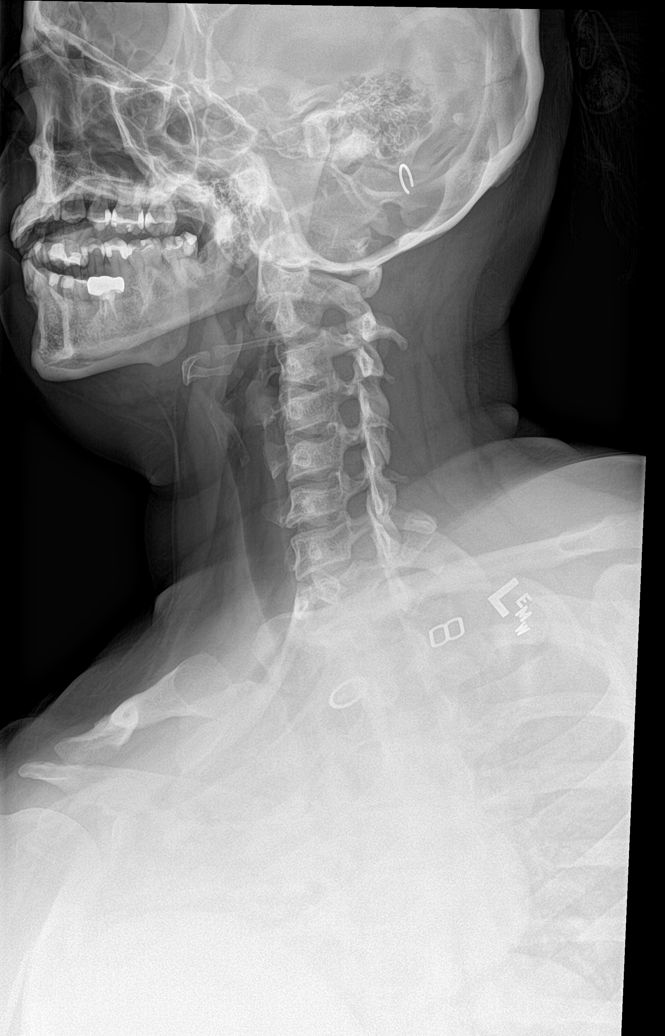

[c-spine ap]
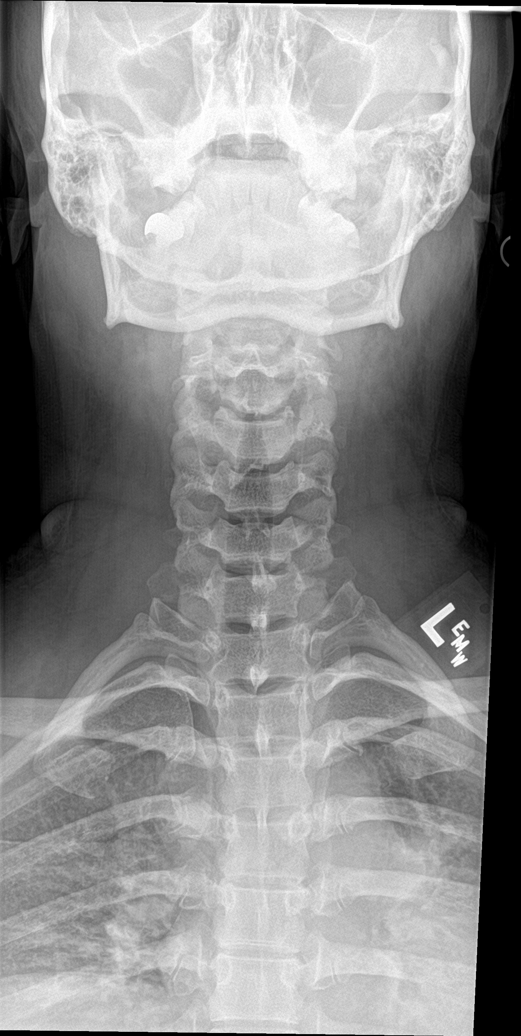

[c-spine open mouth]
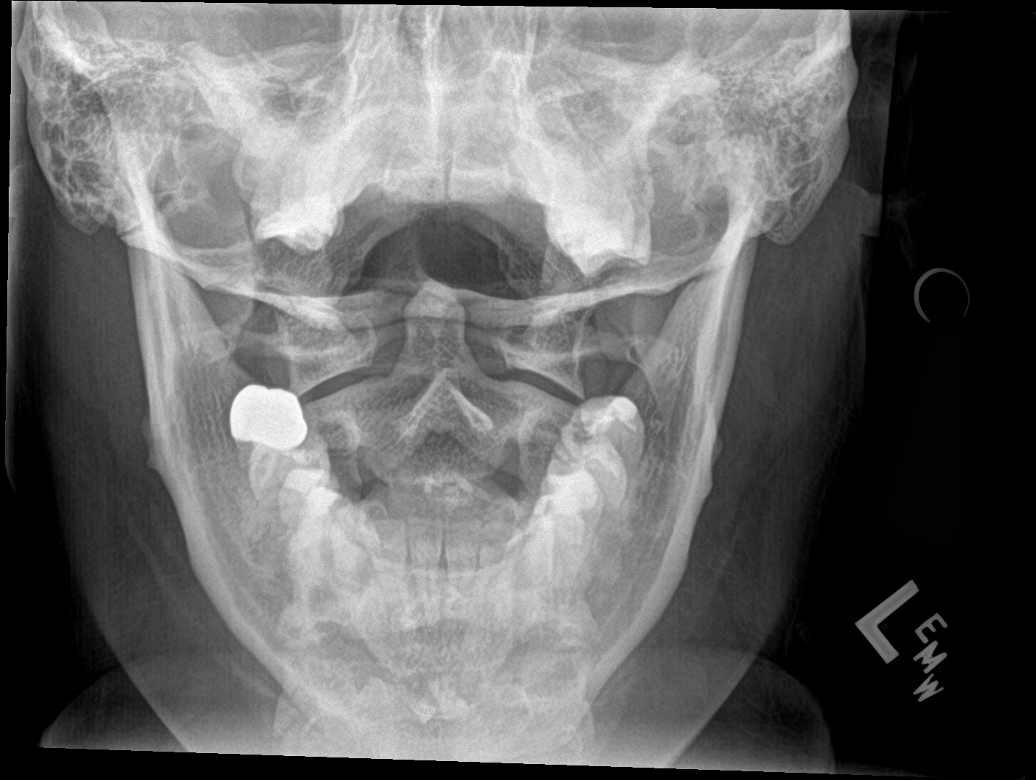

[c-spine swimmers]
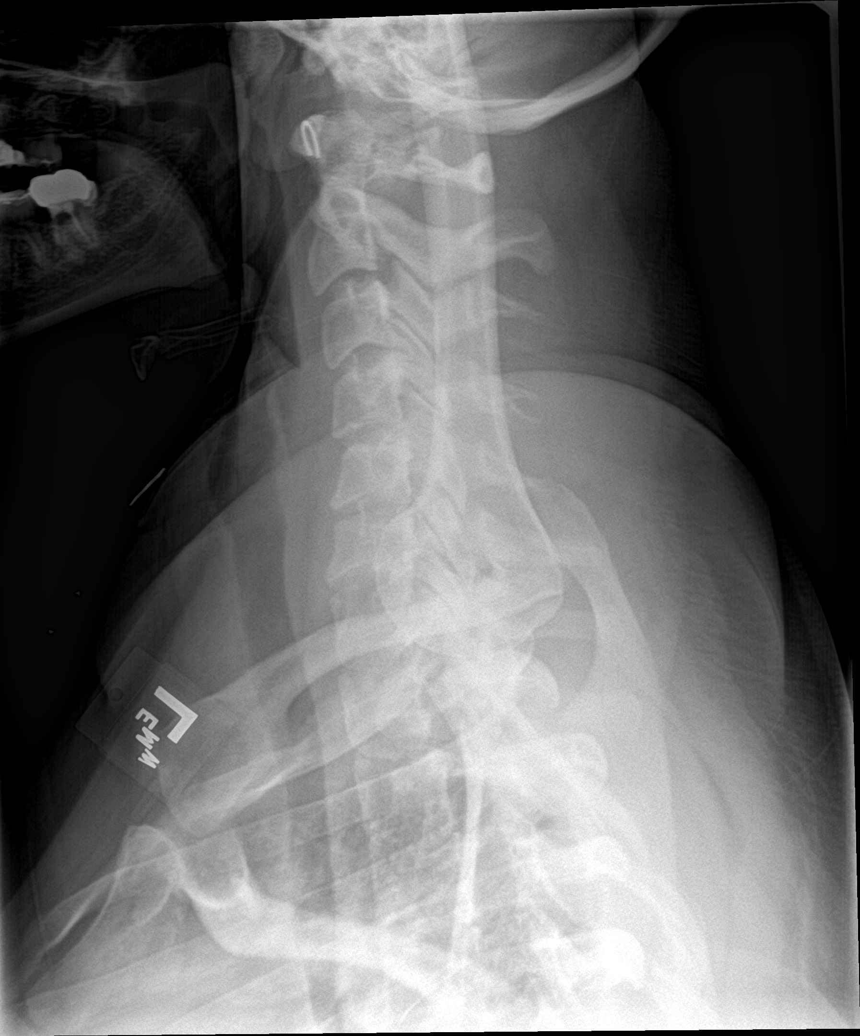

[6 of 6 positions shown; findings below may reference images not displayed]

FINDINGS: There is mild loss of the normal cervical lordosis. The vertebral
bodies are preserved in height. The disc space heights are well
maintained. The oblique views reveal no high-grade bony encroachment
upon the neural foramina. The odontoid is intact. The prevertebral
soft tissue spaces are normal.
IMPRESSION: Loss of the normal cervical lordosis may reflect muscle spasm. There
is no compression fracture, high-grade disc space narrowing, or
significant bony encroachment upon the neural foramina.
